# Patient Record
Sex: Female | Born: 1935 | Race: White | Hispanic: No | Marital: Married | Smoking: Never smoker
Health system: Southern US, Community
[De-identification: ages and names within clinical notes are randomized; demographics above are authoritative.]

---

## 2017-05-07 ENCOUNTER — Observation Stay (HOSPITAL_COMMUNITY)
Admission: EM | Admit: 2017-05-07 | Discharge: 2017-05-07 | Disposition: A | Discharge status: Home / Self Care | Payer: Medicare Other | Attending: Internal Medicine | Admitting: Internal Medicine

## 2017-05-07 ENCOUNTER — Observation Stay (HOSPITAL_COMMUNITY): Payer: Medicare Other

## 2017-05-07 ENCOUNTER — Encounter (HOSPITAL_COMMUNITY): Payer: Self-pay | Admitting: Emergency Medicine

## 2017-05-07 ENCOUNTER — Emergency Department (HOSPITAL_COMMUNITY): Payer: Medicare Other

## 2017-05-07 DIAGNOSIS — E785 Hyperlipidemia, unspecified: Secondary | ICD-10-CM | POA: Diagnosis not present

## 2017-05-07 DIAGNOSIS — Z79899 Other long term (current) drug therapy: Secondary | ICD-10-CM | POA: Diagnosis not present

## 2017-05-07 DIAGNOSIS — Z8249 Family history of ischemic heart disease and other diseases of the circulatory system: Secondary | ICD-10-CM | POA: Diagnosis not present

## 2017-05-07 DIAGNOSIS — R079 Chest pain, unspecified: Principal | ICD-10-CM | POA: Diagnosis present

## 2017-05-07 DIAGNOSIS — Z806 Family history of leukemia: Secondary | ICD-10-CM | POA: Insufficient documentation

## 2017-05-07 DIAGNOSIS — R1013 Epigastric pain: Secondary | ICD-10-CM | POA: Diagnosis present

## 2017-05-07 DIAGNOSIS — I700 Atherosclerosis of aorta: Secondary | ICD-10-CM | POA: Insufficient documentation

## 2017-05-07 DIAGNOSIS — Z823 Family history of stroke: Secondary | ICD-10-CM | POA: Insufficient documentation

## 2017-05-07 DIAGNOSIS — R0789 Other chest pain: Secondary | ICD-10-CM | POA: Diagnosis not present

## 2017-05-07 LAB — COMPREHENSIVE METABOLIC PANEL
ALBUMIN: 3.4 g/dL — AB (ref 3.5–5.0)
ALT: 17 U/L (ref 14–54)
AST: 19 U/L (ref 15–41)
Alkaline Phosphatase: 108 U/L (ref 38–126)
Anion gap: 10 (ref 5–15)
BILIRUBIN TOTAL: 0.4 mg/dL (ref 0.3–1.2)
BUN: 14 mg/dL (ref 6–20)
CHLORIDE: 103 mmol/L (ref 101–111)
CO2: 25 mmol/L (ref 22–32)
CREATININE: 0.96 mg/dL (ref 0.44–1.00)
Calcium: 9.2 mg/dL (ref 8.9–10.3)
GFR calc Af Amer: >60 mL/min (ref >60)
GFR, EST NON AFRICAN AMERICAN: 55 mL/min — AB (ref >60)
GLUCOSE: 173 mg/dL — AB (ref 65–99)
POTASSIUM: 4.3 mmol/L (ref 3.5–5.1)
Sodium: 138 mmol/L (ref 135–145)
Total Protein: 6.2 g/dL — ABNORMAL LOW (ref 6.5–8.1)

## 2017-05-07 LAB — CBC WITH DIFFERENTIAL/PLATELET
Basophils Absolute: 0 10*3/uL (ref 0.0–0.1)
Basophils Relative: 0 %
Eosinophils Absolute: 0.1 10*3/uL (ref 0.0–0.7)
Eosinophils Relative: 1 %
HEMATOCRIT: 39.3 % (ref 36.0–46.0)
Hemoglobin: 13.1 g/dL (ref 12.0–15.0)
LYMPHS ABS: 1.2 10*3/uL (ref 0.7–4.0)
Lymphocytes Relative: 13 %
MCH: 29.8 pg (ref 26.0–34.0)
MCHC: 33.3 g/dL (ref 30.0–36.0)
MCV: 89.5 fL (ref 78.0–100.0)
MONO ABS: 0.5 10*3/uL (ref 0.1–1.0)
MONOS PCT: 6 %
NEUTROS ABS: 7.2 10*3/uL (ref 1.7–7.7)
NEUTROS PCT: 80 %
PLATELETS: 186 10*3/uL (ref 150–400)
RBC: 4.39 MIL/uL (ref 3.87–5.11)
RDW: 13.6 % (ref 11.5–15.5)
WBC: 9 10*3/uL (ref 4.0–10.5)

## 2017-05-07 LAB — HEMOGLOBIN A1C
Hgb A1c MFr Bld: 5.4 % (ref 4.8–5.6)
MEAN PLASMA GLUCOSE: 108.28 mg/dL

## 2017-05-07 LAB — LIPASE, BLOOD: LIPASE: 42 U/L (ref 11–51)

## 2017-05-07 LAB — I-STAT TROPONIN, ED: Troponin i, poc: 0 ng/mL (ref 0.00–0.08)

## 2017-05-07 LAB — TROPONIN I
Troponin I: <0.03 ng/mL (ref <0.03)
Troponin I: <0.03 ng/mL (ref <0.03)

## 2017-05-07 LAB — TSH: TSH: 2.218 u[IU]/mL (ref 0.350–4.500)

## 2017-05-07 MED ORDER — PANTOPRAZOLE SODIUM 40 MG PO TBEC: 40.0000 mg | DELAYED_RELEASE_TABLET | Freq: Every day | ORAL | 0 refills | Status: AC

## 2017-05-07 MED ORDER — ENOXAPARIN SODIUM 40 MG/0.4ML ~~LOC~~ SOLN
40.0000 mg | SUBCUTANEOUS | Status: DC
  Filled 2017-05-07: qty 0.4

## 2017-05-07 MED ORDER — GI COCKTAIL ~~LOC~~
30.0000 mL | Freq: Once | ORAL | Status: AC
  Administered 2017-05-07: 30 mL via ORAL
  Filled 2017-05-07: qty 30

## 2017-05-07 MED ORDER — PANTOPRAZOLE SODIUM 40 MG PO TBEC
40.0000 mg | DELAYED_RELEASE_TABLET | Freq: Every day | ORAL | 0 refills | Status: DC
Start: 2017-05-08 — End: 2017-05-07

## 2017-05-07 MED ORDER — ALUM & MAG HYDROXIDE-SIMETH 200-200-20 MG/5ML PO SUSP: 15.0000 mL | Freq: Four times a day (QID) | ORAL | 0 refills | Status: DC | PRN

## 2017-05-07 MED ORDER — PANTOPRAZOLE SODIUM 40 MG PO TBEC: 40.0000 mg | DELAYED_RELEASE_TABLET | Freq: Every day | ORAL | 0 refills | Status: DC

## 2017-05-07 MED ORDER — NITROGLYCERIN 0.4 MG SL SUBL
0.4000 mg | SUBLINGUAL_TABLET | Freq: Once | SUBLINGUAL | Status: AC
  Administered 2017-05-07: 0.4 mg via SUBLINGUAL
  Filled 2017-05-07: qty 1

## 2017-05-07 MED ORDER — POLYETHYLENE GLYCOL 3350 17 G PO PACK: 17.0000 g | PACK | Freq: Every day | ORAL | 0 refills | Status: DC

## 2017-05-07 MED ORDER — ACETAMINOPHEN 650 MG RE SUPP: 650.0000 mg | Freq: Four times a day (QID) | RECTAL | Status: DC | PRN

## 2017-05-07 MED ORDER — ALUM & MAG HYDROXIDE-SIMETH 200-200-20 MG/5ML PO SUSP: 15.0000 mL | Freq: Four times a day (QID) | ORAL | 0 refills | Status: AC | PRN

## 2017-05-07 MED ORDER — POLYETHYLENE GLYCOL 3350 17 G PO PACK: 17.0000 g | PACK | Freq: Every day | ORAL | 0 refills | Status: AC

## 2017-05-07 MED ORDER — ONDANSETRON HCL 4 MG/2ML IJ SOLN
4.0000 mg | Freq: Once | INTRAMUSCULAR | Status: AC
  Administered 2017-05-07: 4 mg via INTRAVENOUS
  Filled 2017-05-07: qty 2

## 2017-05-07 MED ORDER — ACETAMINOPHEN 325 MG PO TABS: 650.0000 mg | ORAL_TABLET | Freq: Four times a day (QID) | ORAL | Status: DC | PRN

## 2017-05-07 MED ORDER — POLYETHYLENE GLYCOL 3350 17 G PO PACK
17.0000 g | PACK | Freq: Every day | ORAL | Status: DC
  Administered 2017-05-07: 17 g via ORAL
  Filled 2017-05-07: qty 1

## 2017-05-07 MED ORDER — PANTOPRAZOLE SODIUM 40 MG PO TBEC
40.0000 mg | DELAYED_RELEASE_TABLET | Freq: Every day | ORAL | Status: DC
  Administered 2017-05-07: 40 mg via ORAL
  Filled 2017-05-07: qty 1

## 2017-05-07 MED ORDER — SODIUM CHLORIDE 0.9 % IV SOLN
INTRAVENOUS | Status: DC
  Administered 2017-05-07: 06:00:00 via INTRAVENOUS

## 2017-05-07 NOTE — ED Triage Notes (Signed)
Patient arrived with EMS reports central chest pain this evening with emesis x1 after eating supper , received 2 NTG sl , ASA 324 mg and Zofran IV 4 mg by EMS , denies chest pain at arrival .

## 2017-05-07 NOTE — H&P (Addendum)
TRH H&P   Patient Demographics:    Kaitlin Roberson, is a 81 y.o. female  MRN: [id number]   DOB - [birth year]  Admit Date - 05/07/2017  Outpatient Primary MD for the patient is Patient, No Pcp Per Waynard Edwards [place], [place])  Referring MD/NP/PA: Belenda Cruise Ward  Outpatient Specialists:    Patient coming from:    home  Chief Complaint  Patient presents with  . Chest Pain      HPI:   Kaitlin Roberson  is a 81 y.o. female, c/o epigastric discomfort.  Starting yesterday. Pt was concerned that this might be related to her heart and presented to ED for evaluation.  slg nitro without relief.  In ED  EKG nsr at 52, nl axis, no st-t changes c/w ischemia.   CXR  IMPRESSION: No active cardiopulmonary disease.  Na 138, K 4.3, Bun 14, Creatinine 0.96, Ast 19, Alt 17 Alb 3.4 Wbc 9.0, Hgb 13.1, Plt 186 Lipase 42  Trop I negative  Pt will be admitted for epigastric pain due to ED concerns that this might be related to ischemic heart disease.     Review of systems:    In addition to the HPI above,  No Fever-chills, No Headache, No changes with Vision or hearing, No problems swallowing food or Liquids, No Chest pain, Cough or Shortness of Breath, No Nausea or Vommitting, Bowel movements are regular, No Blood in stool or Urine, No dysuria, No new skin rashes or bruises, No new joints pains-aches,  No new weakness, tingling, numbness in any extremity, No recent weight gain or loss, No polyuria, polydypsia or polyphagia, No significant Mental Stressors.  A full 10 point Review of Systems was done, except as stated above, all other Review of Systems were negative.   With Past History of the following :    History reviewed. No pertinent past medical history.   Borderline cholesterol  History reviewed. No pertinent surgical history.  No surgeries per pt   Social  History:     Social History   Tobacco Use  . Smoking status: Never Smoker  . Smokeless tobacco: Never Used  Substance Use Topics  . Alcohol use: Yes    Frequency: Never    Comment: rare     Lives - at home  Mobility -   Walks by self   Family History :     Family History  Problem Relation Age of Onset  . Leukemia Mother   . Stroke Father   . CAD Brother      Home Medications:   Prior to Admission medications   Medication Sig Start Date End Date Taking? Authorizing Provider  CALCIUM PO Take 1 tablet by mouth daily.   Yes [provider]  Cholecalciferol (D3-1000) 1000 units capsule Take 1,000 Units by mouth daily. 05/09/13  Yes [provider]  MENAQUINONE-7 PO Take 1 tablet by mouth  daily. 05/01/13  Yes [provider]  Multiple Vitamin (MULTIVITAMIN WITH MINERALS) TABS tablet Take 1 tablet by mouth daily.   Yes [provider]  Multiple Vitamins-Minerals (ZINC PO) Take 1 tablet by mouth daily.   Yes [provider]     Allergies:    No Known Allergies   Physical Exam:   Vitals  Blood pressure (!) 140/55, pulse (!) 48, temperature 98 F (36.7 C), temperature source Oral, resp. rate 18, SpO2 98 %.   1. General  lying in bed in NAD,    2. Normal affect and insight, Not Suicidal or Homicidal, Awake Alert, Oriented X 3.  3. No F.N deficits, ALL C.Nerves Intact, Strength 5/5 all 4 extremities, Sensation intact all 4 extremities, Plantars down going.  4. Ears and Eyes appear Normal, Conjunctivae clear, PERRLA. Moist Oral Mucosa.  5. Supple Neck, No JVD, No cervical lymphadenopathy appriciated, No Carotid Bruits.  6. Symmetrical Chest wall movement, Good air movement bilaterally, CTAB.  7. RRR, No Gallops, Rubs or Murmurs, No Parasternal Heave.  8. Positive Bowel Sounds, Abdomen Soft, No tenderness, No organomegaly appriciated,No rebound -guarding or rigidity.  9.  No Cyanosis, Normal Skin Turgor, No Skin Rash  or Bruise.  10. Good muscle tone,  joints appear normal , no effusions, Normal ROM.  11. No Palpable Lymph Nodes in Neck or Axillae     Data Review:    CBC Recent Labs  Lab 05/07/17 0332  WBC 9.0  HGB 13.1  HCT 39.3  PLT 186  MCV 89.5  MCH 29.8  MCHC 33.3  RDW 13.6  LYMPHSABS 1.2  MONOABS 0.5  EOSABS 0.1  BASOSABS 0.0   ------------------------------------------------------------------------------------------------------------------  Chemistries  Recent Labs  Lab 05/07/17 0332  NA 138  K 4.3  CL 103  CO2 25  GLUCOSE 173*  BUN 14  CREATININE 0.96  CALCIUM 9.2  AST 19  ALT 17  ALKPHOS 108  BILITOT 0.4   ------------------------------------------------------------------------------------------------------------------ CrCl cannot be calculated (Unknown ideal weight.). ------------------------------------------------------------------------------------------------------------------ No results for input(s): TSH, T4TOTAL, T3FREE, THYROIDAB in the last 72 hours.  Invalid input(s): FREET3  Coagulation profile No results for input(s): INR, PROTIME in the last 168 hours. ------------------------------------------------------------------------------------------------------------------- No results for input(s): DDIMER in the last 72 hours. -------------------------------------------------------------------------------------------------------------------  Cardiac Enzymes No results for input(s): CKMB, TROPONINI, MYOGLOBIN in the last 168 hours.  Invalid input(s): CK ------------------------------------------------------------------------------------------------------------------ No results found for: BNP   ---------------------------------------------------------------------------------------------------------------  Urinalysis No results found for: COLORURINE, APPEARANCEUR, LABSPEC, PHURINE, GLUCOSEU, HGBUR, BILIRUBINUR, KETONESUR, PROTEINUR, UROBILINOGEN,  NITRITE, LEUKOCYTESUR  ----------------------------------------------------------------------------------------------------------------   Imaging Results:    Dg Chest 2 View  Result Date: 05/07/2017 CLINICAL DATA:  81 year old female with chest pain. EXAM: CHEST  2 VIEW COMPARISON:  None. FINDINGS: The lungs are clear. There is no pleural effusion or pneumothorax. Top-normal cardiac size. Atherosclerotic calcification of the aortic arch. Degenerative changes of the spine. No acute osseous pathology. IMPRESSION: No active cardiopulmonary disease. Electronically Signed   By: Elgie CollardArash  Radparvar M.D.   On: 05/07/2017 04:14     Assessment & Plan:    Active Problems:   Chest pain    Epigastric pain ? Concerns for ischemic heart disease by Zettie CooleyKristin Ward DO protonix Tele Trop I q6h x3 Check echo Cardiology consult by email I think that her symptoms are atypical for ischemic heart disease  Will defer to cardiology if think stress testing needed. Appreciate input.   Bradycardia Check TSH  Hyperglycemia Check hga1c   DVT Prophylaxis-  Lovenox - SCDs  AM Labs Ordered, also please review Full Orders  Family Communication: Admission, patients condition and plan of care including tests being ordered have been discussed with the patient  who indicate understanding and agree with the plan and Code Status.  Code Status FULL CODE  Likely DC to  home  Condition GUARDED    Consults called: cardiology by email  Admission status:  observation  Time spent in minutes : 45   Pearson GrippeJames Ariane Ditullio M.D on 05/07/2017 at 6:09 AM  Between 7pm to 7am - Pager - [phone]   After 7am go to [email or web address] - password [email or web address]  [facility] - Office  [phone]

## 2017-05-07 NOTE — ED Provider Notes (Addendum)
TIME SEEN: 3:27 AM  CHIEF COMPLAINT: Chest pain  HPI: Patient is a 81 year old female with history of hyperlipidemia, family history of coronary artery disease who presents to the emergency department with an episode of chest pain that started this evening.  She is unable to describe the pain other than stating it felt like "indigestion".  She does state that it feels different than previous episodes of indigestion that she has never had she states she had nausea, vomiting x1 and diaphoresis.  No shortness of breath.  Denies history of hypertension, diabetes, tobacco use.  No prior history of stress test or cardiac catheterization.  Was given aspirin and 2 nitroglycerin with EMS and states this significantly helped her pain but is not completely gone.  She is here visiting family.  [place] from [place] to [place].  ROS: See HPI Constitutional: no fever  Eyes: no drainage  ENT: no runny nose   Cardiovascular:   chest pain  Resp: no SOB  GI: no vomiting GU: no dysuria Integumentary: no rash  Allergy: no hives  Musculoskeletal: no leg swelling  Neurological: no slurred speech ROS otherwise negative  PAST MEDICAL HISTORY/PAST SURGICAL HISTORY:  History reviewed. No pertinent past medical history.  MEDICATIONS:  Prior to Admission medications   Not on File    ALLERGIES:  No Known Allergies  SOCIAL HISTORY:  Social History   Tobacco Use  . Smoking status: Never Smoker  . Smokeless tobacco: Never Used  Substance Use Topics  . Alcohol use: Yes    FAMILY HISTORY: No family history on file.  EXAM: BP (!) 132/116 (BP Location: Right Arm)   Pulse (!) 53   Temp 98 F (36.7 C) (Oral)   Resp 14   SpO2 93%  CONSTITUTIONAL: Alert and oriented and responds appropriately to questions.  Elderly, in no significant distress, afebrile HEAD: Normocephalic EYES: Conjunctivae clear, pupils appear equal, EOMI ENT: normal nose; moist mucous membranes NECK: Supple, no  meningismus, no nuchal rigidity, no LAD  CARD: RRR; S1 and S2 appreciated; no murmurs, no clicks, no rubs, no gallops RESP: Normal chest excursion without splinting or tachypnea; breath sounds clear and equal bilaterally; no wheezes, no rhonchi, no rales, no hypoxia or respiratory distress, speaking full sentences ABD/GI: Normal bowel sounds; non-distended; soft, non-tender, no rebound, no guarding, no peritoneal signs, no hepatosplenomegaly BACK:  The back appears normal and is non-tender to palpation, there is no CVA tenderness EXT: Normal ROM in all joints; non-tender to palpation; no edema; normal capillary refill; no cyanosis, no calf tenderness or swelling    SKIN: Normal color for age and race; warm; no rash NEURO: Moves all extremities equally PSYCH: The patient's mood and manner are appropriate. Grooming and personal hygiene are appropriate.  MEDICAL DECISION MAKING: Patient with chest pain.  Improved with aspirin and nitroglycerin.  EKG shows no ischemic abnormality.  Heart score is a 4.  Doubt dissection or PE.  I am concerned for possible ACS.  I did discuss with her that this could be something as benign as is GERD, esophagitis, esophageal spasm but I think we need to rule out ACS.  Will obtain cardiac labs.  Her abdominal exam is benign.  Will obtain chest x-ray.  I have recommended admission.  Patient and family comfortable with this plan.  We will give her third nitroglycerin here in the emergency department.  ED PROGRESS: Patient's labs are unremarkable other than mildly elevated glucose.  Troponin negative.  Chest x-ray  clear.  Will discuss with medicine for admission for ACS rule out.   4:31 AM Discussed patient's case with hospitalist, Dr. Selena BattenKim.  I have recommended admission and patient (and family if present) agree with this plan. Admitting physician will place admission orders.   I reviewed all nursing notes, vitals, pertinent previous records, EKGs, lab and urine results,  imaging (as available).    EKG Interpretation  Date/Time:  Wednesday May 07 2017 02:54:17 EST Ventricular Rate:  52 PR Interval:    QRS Duration: 80 QT Interval:  442 QTC Calculation: 411 R Axis:   29 Text Interpretation:  Sinus rhythm No old tracing to compare Confirmed by Vincenza Dail, Baxter HireKristen [id number]) on 05/07/2017 2:57:10 AM         Adhya Cocco, Layla MawKristen N, DO 05/07/17 0431    Jayra Choyce, Layla MawKristen N, DO 05/07/17 [phone]

## 2017-05-07 NOTE — Consult Note (Signed)
Cardiology Consult    Patient ID: Kaitlin Roberson MRN: [id number], DOB/AGE: 12-07-1935   Admit date: 05/07/2017 Date of Consult: 05/07/2017  Primary Physician: Patient, No Pcp Per Primary Cardiologist: New Requesting Provider: Allena KatzPatel Reason for Consultation: epigastric pain  Kaitlin MeliaMarianne Roberson is a 81 y.o. female who is being seen today for the evaluation of epigastric pain at the request of No ref. provider found.   Patient Profile    81 yo female with no PMH who presented to the ED with epigastric pain.   Past Medical History   History reviewed. No pertinent past medical history.  History reviewed. No pertinent surgical history.   Allergies  No Known Allergies  History of Present Illness    Kaitlin Roberson is a 81 yo female with no reported PMH.  She currently lives in [place] with her husband.  States she is followed by her PCP on a regular basis, and goes for routine physicals.  States she is normally physically active at home, walks regularly and does all of her own household chores.  She does not normally experience any anginal symptoms, or dyspnea with activity.  They are currently here in the area visiting her brother who is very ill.  They are staying at a hotel.  States yesterday evening she went out to dinner and ate at a [employer] restaurant, and had spicy food.  Later that night developed epigastric discomfort, which continued throughout the night and became worse around 1 AM this morning.  At this point her husband became concerned that this could have been cardiac related and brought her to the hospital.  On his way here reports getting lost via his GPSand EMS ultimately was contacted and brought the patient to the hospital.  ER her labs showed stable electrolytes, troponin negative x3, hemoglobin 13.1.  Chest x-ray negative.  EKG showed normal sinus rhythm with no acute ST/T wave abnormalities.  She was given GI cocktail, Protonix and MiraLAX with improvement in her  symptoms.  Currently at the time of exam she had some mild nausea which improved by the end of my interview.  She denies any history of tobacco abuse.  Inpatient Medications    . enoxaparin (LOVENOX) injection  40 mg Subcutaneous Q24H  . pantoprazole  40 mg Oral Daily  . polyethylene glycol  17 g Oral Daily    Family History    Family History  Problem Relation Age of Onset  . Leukemia Mother   . Stroke Father   . CAD Brother     Social History    Social History   Socioeconomic History  . Marital status: Married    Spouse name: Not on file  . Number of children: Not on file  . Years of education: Not on file  . Highest education level: Not on file  Social Needs  . Financial resource strain: Not on file  . Food insecurity - worry: Not on file  . Food insecurity - inability: Not on file  . Transportation needs - medical: Not on file  . Transportation needs - non-medical: Not on file  Occupational History  . Not on file  Tobacco Use  . Smoking status: Never Smoker  . Smokeless tobacco: Never Used  Substance and Sexual Activity  . Alcohol use: Yes    Frequency: Never    Comment: rare  . Drug use: No  . Sexual activity: Not on file  Other Topics Concern  . Not on file  Social History Narrative  .  Not on file     Review of Systems    All other systems reviewed and are otherwise negative except as noted above.  Physical Exam    Blood pressure (!) 155/74, pulse (!) 46, temperature 98 F (36.7 C), temperature source Oral, resp. rate 15, SpO2 97 %.  General: Pleasant, NAD Psych: Normal affect. Neuro: Alert and oriented X 3. Moves all extremities spontaneously. HEENT: Normal  Neck: Supple without bruits or JVD. Lungs:  Resp regular and unlabored, CTA. Heart: RRR no s3, s4, or murmurs. Abdomen: Soft, non-tender, non-distended, BS + x 4.  Extremities: No clubbing, cyanosis or edema. DP/PT/Radials 2+ and equal bilaterally.  Labs    Troponin [facility](Point of Care  Test) Recent Labs    05/07/17 0343  TROPIPOC 0.00   Recent Labs    05/07/17 0618 05/07/17 1229  TROPONINI <0.03 <0.03   Lab Results  Component Value Date   WBC 9.0 05/07/2017   HGB 13.1 05/07/2017   HCT 39.3 05/07/2017   MCV 89.5 05/07/2017   PLT 186 05/07/2017    Recent Labs  Lab 05/07/17 0332  NA 138  K 4.3  CL 103  CO2 25  BUN 14  CREATININE 0.96  CALCIUM 9.2  PROT 6.2*  BILITOT 0.4  ALKPHOS 108  ALT 17  AST 19  GLUCOSE 173*   No results found for: CHOL, HDL, LDLCALC, TRIG No results found for: [facility]   Radiology Studies    Dg Chest 2 View  Result Date: 05/07/2017 CLINICAL DATA:  81 year old female with chest pain. EXAM: CHEST  2 VIEW COMPARISON:  None. FINDINGS: The lungs are clear. There is no pleural effusion or pneumothorax. Top-normal cardiac size. Atherosclerotic calcification of the aortic arch. Degenerative changes of the spine. No acute osseous pathology. IMPRESSION: No active cardiopulmonary disease. Electronically Signed   By: Elgie CollardArash  Radparvar M.D.   On: 05/07/2017 04:14    ECG & Cardiac Imaging    EKG: Sinus rhythm with no acute ST/T wave abnormalities  Assessment & Plan    81 yo female with no PMH who presented to the ED with epigastric pain.  Epigastric pain: Reports developing after spicy [place] food for dinner, and lingered throughout the night into this morning.  Symptoms do not appear to be ACS in nature, more GI related.  Troponins negative x3, EKG sinus rhythm with no acute abnormalities.  She never experienced any chest discomfort.  epigastric pain was not responsive to sublingual nitroglycerin. She does not have concerning risk factors, and has never used tobacco.  Hemoglobin A1c 5.4, TSH 2.2.  At this time would not consider further ischemic workup.  Janice CoffinSigned, Lindsay Roberts, NP-C Pager [phone] 05/07/2017, 2:11 PM Patient seen and examined and history reviewed. Agree with above findings and plan. Very pleasant 81 yo WF  [place]. Ate spicy [place] food last night and developed significant epigastric pain associated with nausea and vomiting x 1. No chest pain or dyspnea. Pain lasted > 12 hours and came to ED. Partial relief here with GI cocktail. No cardiac risk factors and on no meds. On exam she is in NAD Lungs clear. CV RRR without gallop or murmu Abdomen soft/NT. BS + No edema  Ecg is normal. I have personally reviewed and interpreted this study. CXR is normal. I have personally reviewed and interpreted this study. Troponin negative x 3  Impression: acute epigastric pain. No indication of any cardiac issues. Normal Ecg and troponins despite very prolonged  pain. No further cardiac work up needed. Would treat for GI related pain.   Deronte Solis [place], MDFACC 05/07/2017 2:37 PM

## 2017-05-07 NOTE — Progress Notes (Signed)
[facility] PLAN OF CARE NOTE Patient: Kaitlin Roberson ZOX:[id number]   PCP: Patient, No Pcp Per DOB: Mar 17, 1936   DOA: 05/07/2017   DOS: 05/07/2017    Patient was admitted by my colleague Dr. Selena BattenKim earlier on 05/07/2017. I have reviewed the H&P as well as assessment and plan and agree with the same. Important changes in the plan are listed below.  Plan of care: Active Problems:   Chest pain Troponin so far negative, cardiology consulted, await recommendation.  Author: Lynden OxfordPranav Graves Nipp, MD [facility] Pager: [phone] 05/07/2017 7:50 AM   If 7PM-7AM, please contact night-coverage at [email or web address], password [email or web address]

## 2017-05-07 NOTE — ED Notes (Signed)
Admitting [facility] to [facility] RN @ [phone] to [phone].

## 2018-06-23 IMAGING — CR DG CHEST 2V
2 series · 2 of 2 positions shown · non-contrast
Comparison: None.

CLINICAL DATA: 81-year-old female with chest pain.

EXAM:
CHEST  2 VIEW

[chest lat]
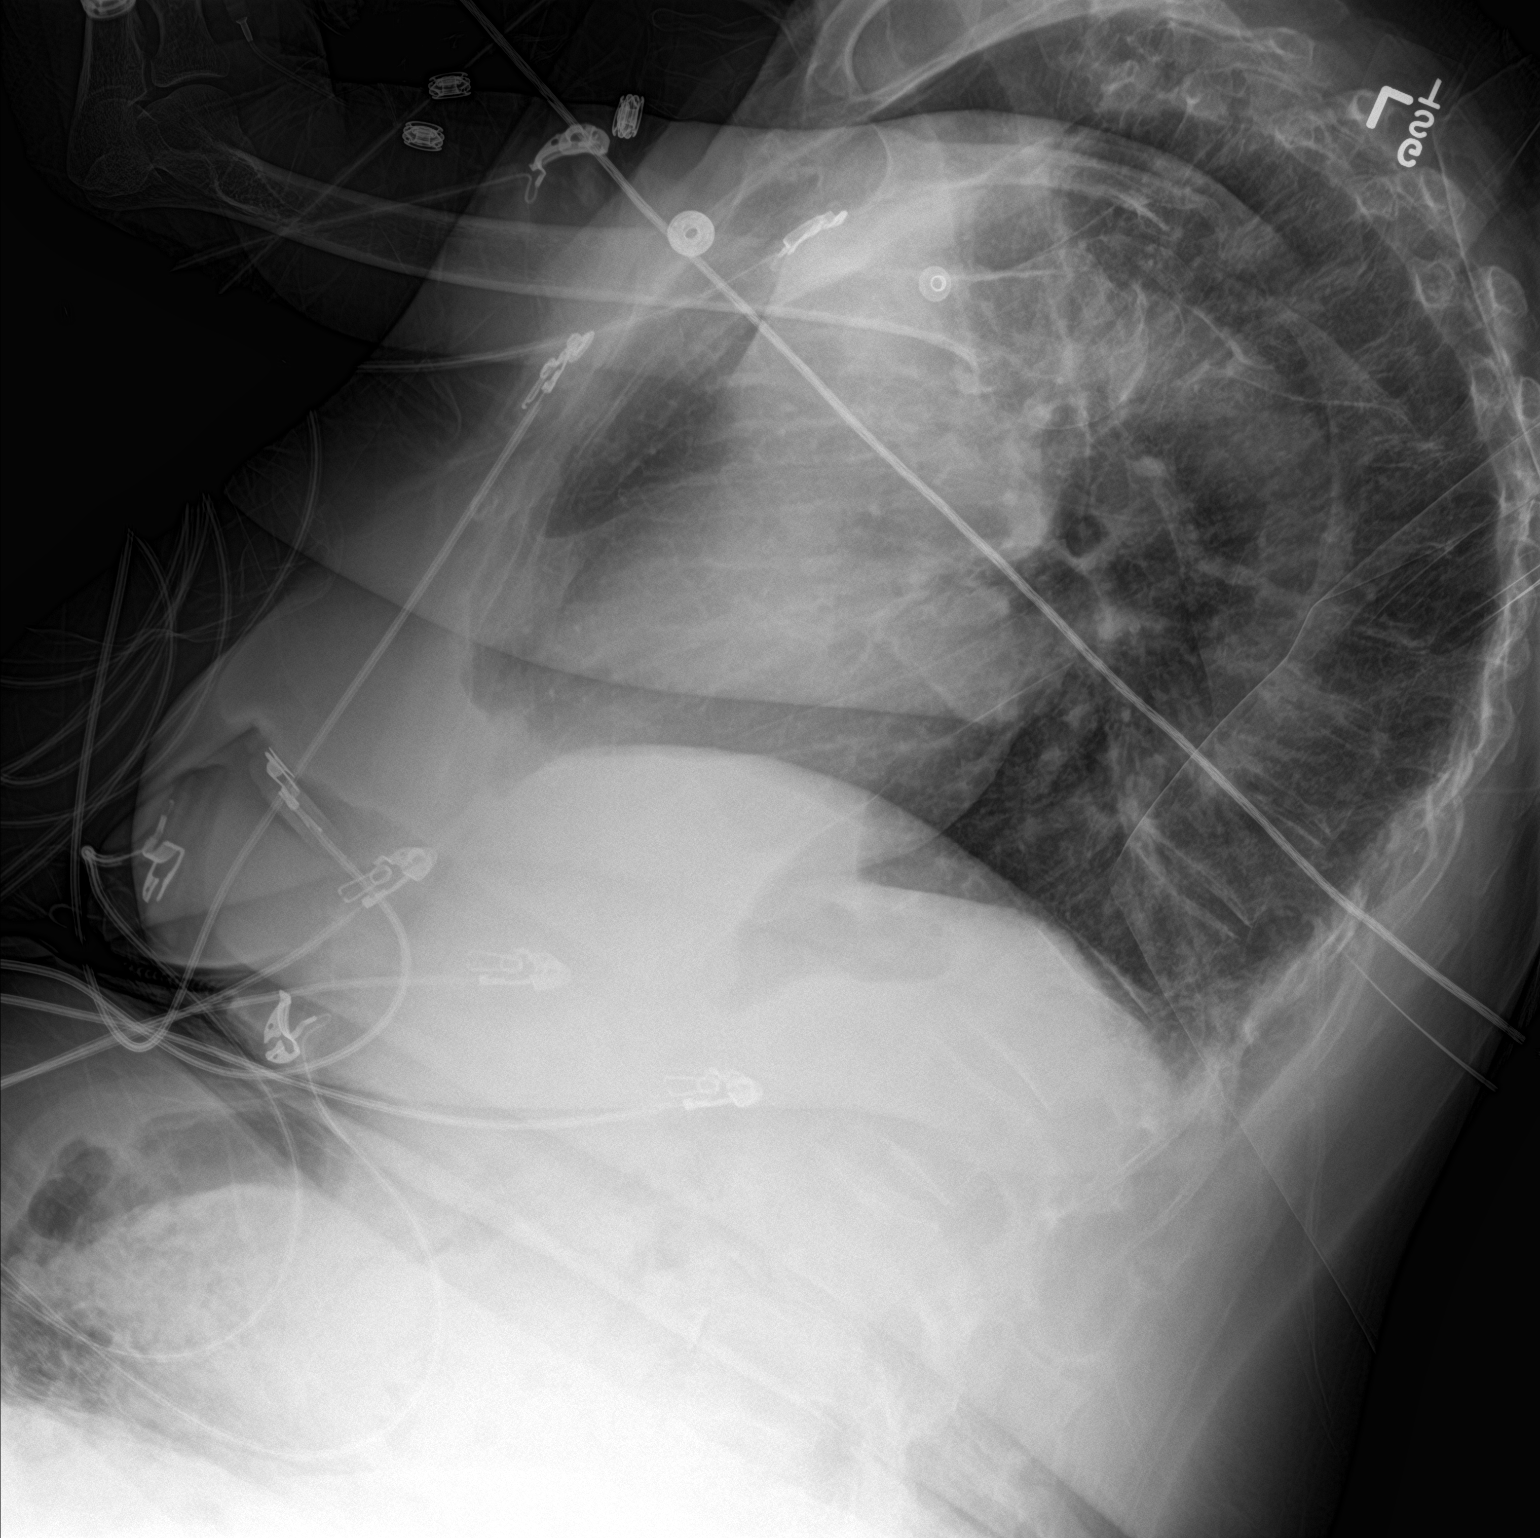

[chest ap]
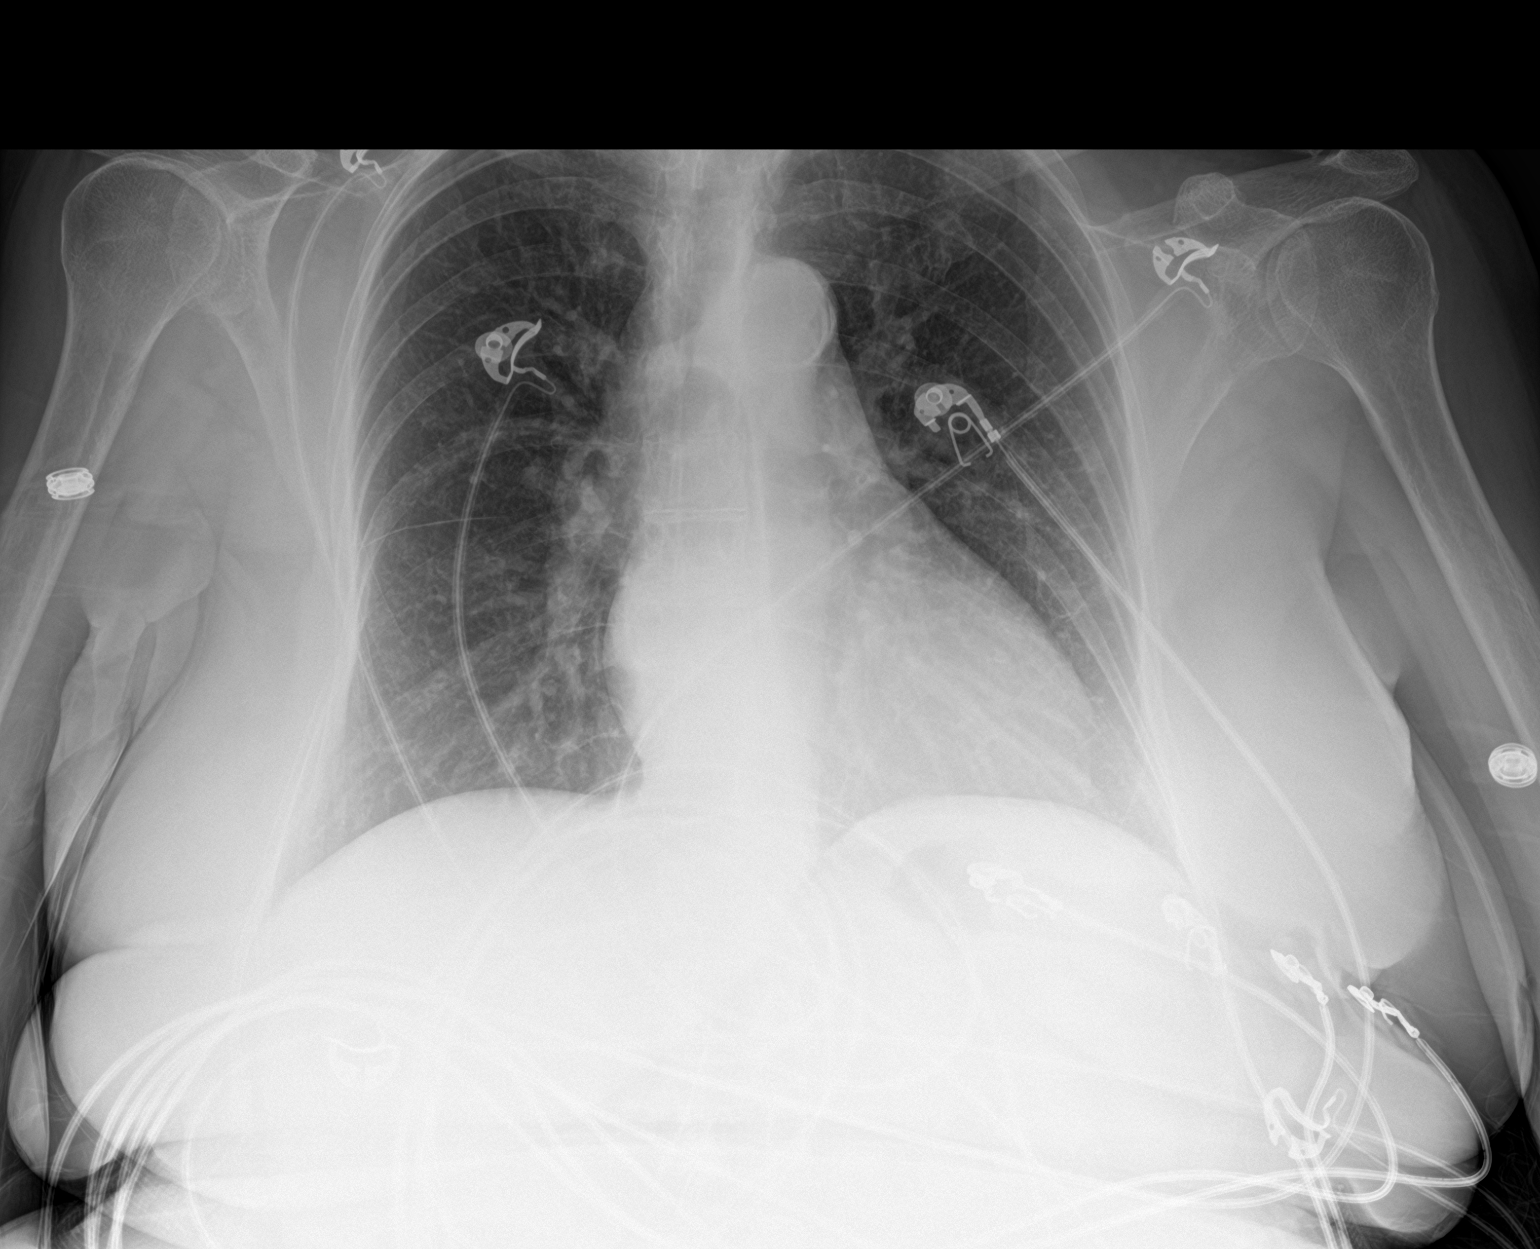

[2 of 2 positions shown; findings below may reference images not displayed]

FINDINGS: The lungs are clear. There is no pleural effusion or pneumothorax.
Top-normal cardiac size. Atherosclerotic calcification of the aortic
arch. Degenerative changes of the spine. No acute osseous pathology.
IMPRESSION: No active cardiopulmonary disease.
